# Patient Record
Sex: Female | Born: 1940 | Race: White | Hispanic: No | Marital: Married | Smoking: Never smoker
Health system: Southern US, Community
[De-identification: ages and names within clinical notes are randomized; demographics above are authoritative.]

## PROBLEM LIST (undated history)

## (undated) DIAGNOSIS — E785 Hyperlipidemia, unspecified: Secondary | ICD-10-CM

## (undated) DIAGNOSIS — E079 Disorder of thyroid, unspecified: Secondary | ICD-10-CM

## (undated) DIAGNOSIS — M549 Dorsalgia, unspecified: Secondary | ICD-10-CM

## (undated) DIAGNOSIS — M67911 Unspecified disorder of synovium and tendon, right shoulder: Secondary | ICD-10-CM

## (undated) HISTORY — PX: HERNIA REPAIR: SHX51

---

## 1999-05-20 ENCOUNTER — Other Ambulatory Visit: Admission: RE | Admit: 1999-05-20 | Discharge: 1999-05-20 | Payer: Self-pay | Admitting: Obstetrics & Gynecology

## 2013-03-13 DIAGNOSIS — M67919 Unspecified disorder of synovium and tendon, unspecified shoulder: Secondary | ICD-10-CM | POA: Insufficient documentation

## 2013-03-16 DIAGNOSIS — M7531 Calcific tendinitis of right shoulder: Secondary | ICD-10-CM | POA: Insufficient documentation

## 2014-02-21 DIAGNOSIS — K5792 Diverticulitis of intestine, part unspecified, without perforation or abscess without bleeding: Secondary | ICD-10-CM | POA: Insufficient documentation

## 2014-02-27 DIAGNOSIS — E785 Hyperlipidemia, unspecified: Secondary | ICD-10-CM | POA: Insufficient documentation

## 2014-02-27 DIAGNOSIS — E039 Hypothyroidism, unspecified: Secondary | ICD-10-CM | POA: Insufficient documentation

## 2014-07-18 DIAGNOSIS — L821 Other seborrheic keratosis: Secondary | ICD-10-CM | POA: Insufficient documentation

## 2015-01-22 DIAGNOSIS — R739 Hyperglycemia, unspecified: Secondary | ICD-10-CM | POA: Insufficient documentation

## 2015-07-17 DIAGNOSIS — Z86010 Personal history of colonic polyps: Secondary | ICD-10-CM | POA: Insufficient documentation

## 2016-01-15 DIAGNOSIS — M542 Cervicalgia: Secondary | ICD-10-CM | POA: Insufficient documentation

## 2016-01-15 DIAGNOSIS — M545 Low back pain, unspecified: Secondary | ICD-10-CM | POA: Insufficient documentation

## 2016-01-15 DIAGNOSIS — M546 Pain in thoracic spine: Secondary | ICD-10-CM | POA: Insufficient documentation

## 2016-01-15 DIAGNOSIS — M7702 Medial epicondylitis, left elbow: Secondary | ICD-10-CM | POA: Insufficient documentation

## 2016-06-09 DIAGNOSIS — H903 Sensorineural hearing loss, bilateral: Secondary | ICD-10-CM | POA: Insufficient documentation

## 2016-06-09 DIAGNOSIS — H8111 Benign paroxysmal vertigo, right ear: Secondary | ICD-10-CM | POA: Insufficient documentation

## 2016-08-17 ENCOUNTER — Encounter (HOSPITAL_BASED_OUTPATIENT_CLINIC_OR_DEPARTMENT_OTHER): Payer: Self-pay | Admitting: Emergency Medicine

## 2016-08-17 ENCOUNTER — Emergency Department (HOSPITAL_BASED_OUTPATIENT_CLINIC_OR_DEPARTMENT_OTHER): Payer: Medicare Other

## 2016-08-17 ENCOUNTER — Emergency Department (HOSPITAL_BASED_OUTPATIENT_CLINIC_OR_DEPARTMENT_OTHER)
Admission: EM | Admit: 2016-08-17 | Discharge: 2016-08-18 | Disposition: A | Discharge status: Home / Self Care | Payer: Medicare Other | Attending: Emergency Medicine | Admitting: Emergency Medicine

## 2016-08-17 DIAGNOSIS — E039 Hypothyroidism, unspecified: Secondary | ICD-10-CM | POA: Insufficient documentation

## 2016-08-17 DIAGNOSIS — M7551 Bursitis of right shoulder: Secondary | ICD-10-CM | POA: Insufficient documentation

## 2016-08-17 DIAGNOSIS — Z79899 Other long term (current) drug therapy: Secondary | ICD-10-CM | POA: Insufficient documentation

## 2016-08-17 DIAGNOSIS — R509 Fever, unspecified: Secondary | ICD-10-CM

## 2016-08-17 DIAGNOSIS — M25511 Pain in right shoulder: Secondary | ICD-10-CM | POA: Diagnosis present

## 2016-08-17 HISTORY — DX: Unspecified disorder of synovium and tendon, right shoulder: M67.911

## 2016-08-17 HISTORY — DX: Dorsalgia, unspecified: M54.9

## 2016-08-17 HISTORY — DX: Hyperlipidemia, unspecified: E78.5

## 2016-08-17 HISTORY — DX: Disorder of thyroid, unspecified: E07.9

## 2016-08-17 LAB — URINALYSIS, MICROSCOPIC (REFLEX)

## 2016-08-17 LAB — COMPREHENSIVE METABOLIC PANEL
ALT: 39 U/L (ref 14–54)
AST: 36 U/L (ref 15–41)
Albumin: 4.4 g/dL (ref 3.5–5.0)
Alkaline Phosphatase: 80 U/L (ref 38–126)
Anion gap: 8 (ref 5–15)
BILIRUBIN TOTAL: 0.6 mg/dL (ref 0.3–1.2)
BUN: 15 mg/dL (ref 6–20)
CO2: 26 mmol/L (ref 22–32)
Calcium: 9.8 mg/dL (ref 8.9–10.3)
Chloride: 104 mmol/L (ref 101–111)
Creatinine, Ser: 1.03 mg/dL — ABNORMAL HIGH (ref 0.44–1.00)
GFR, EST NON AFRICAN AMERICAN: 52 mL/min — AB (ref >60)
Glucose, Bld: 128 mg/dL — ABNORMAL HIGH (ref 65–99)
POTASSIUM: 4.1 mmol/L (ref 3.5–5.1)
Sodium: 138 mmol/L (ref 135–145)
TOTAL PROTEIN: 8 g/dL (ref 6.5–8.1)

## 2016-08-17 LAB — SEDIMENTATION RATE: Sed Rate: 43 mm/hr — ABNORMAL HIGH (ref 0–22)

## 2016-08-17 LAB — URINALYSIS, ROUTINE W REFLEX MICROSCOPIC
Bilirubin Urine: NEGATIVE
Glucose, UA: NEGATIVE mg/dL
KETONES UR: 15 mg/dL — AB
Leukocytes, UA: NEGATIVE
NITRITE: NEGATIVE
PH: 6 (ref 5.0–8.0)
Protein, ur: NEGATIVE mg/dL
Specific Gravity, Urine: 1.022 (ref 1.005–1.030)

## 2016-08-17 LAB — CBC WITH DIFFERENTIAL/PLATELET
BASOS ABS: 0 10*3/uL (ref 0.0–0.1)
Basophils Relative: 0 %
EOS PCT: 0 %
Eosinophils Absolute: 0 10*3/uL (ref 0.0–0.7)
HEMATOCRIT: 37.6 % (ref 36.0–46.0)
HEMOGLOBIN: 12.3 g/dL (ref 12.0–15.0)
LYMPHS PCT: 15 %
Lymphs Abs: 1.8 10*3/uL (ref 0.7–4.0)
MCH: 29.8 pg (ref 26.0–34.0)
MCHC: 32.7 g/dL (ref 30.0–36.0)
MCV: 91 fL (ref 78.0–100.0)
Monocytes Absolute: 1.4 10*3/uL — ABNORMAL HIGH (ref 0.1–1.0)
Monocytes Relative: 12 %
NEUTROS ABS: 8.8 10*3/uL — AB (ref 1.7–7.7)
NEUTROS PCT: 73 %
PLATELETS: 220 10*3/uL (ref 150–400)
RBC: 4.13 MIL/uL (ref 3.87–5.11)
RDW: 13.3 % (ref 11.5–15.5)
WBC: 12 10*3/uL — AB (ref 4.0–10.5)

## 2016-08-17 MED ORDER — LIDOCAINE HCL (PF) 1 % IJ SOLN
5.0000 mL | Freq: Once | INTRAMUSCULAR | Status: AC
  Administered 2016-08-17: 5 mL via INTRADERMAL
  Filled 2016-08-17: qty 5

## 2016-08-17 MED ORDER — MORPHINE SULFATE (PF) 4 MG/ML IV SOLN
4.0000 mg | Freq: Once | INTRAVENOUS | Status: AC
  Administered 2016-08-17: 4 mg via INTRAVENOUS
  Filled 2016-08-17: qty 1

## 2016-08-17 MED ORDER — OXYCODONE HCL 5 MG PO TABS: 2.5000 mg | ORAL_TABLET | ORAL | 0 refills | Status: DC | PRN

## 2016-08-17 MED ORDER — SODIUM CHLORIDE 0.9 % IV BOLUS (SEPSIS)
1000.0000 mL | Freq: Once | INTRAVENOUS | Status: AC
  Administered 2016-08-17: 1000 mL via INTRAVENOUS

## 2016-08-17 MED ORDER — ACETAMINOPHEN 325 MG PO TABS
650.0000 mg | ORAL_TABLET | Freq: Once | ORAL | Status: AC
Start: 2016-08-17 — End: 2016-08-17
  Administered 2016-08-17: 650 mg via ORAL
  Filled 2016-08-17: qty 2

## 2016-08-17 MED ORDER — DOCUSATE SODIUM 100 MG PO CAPS: 100.0000 mg | ORAL_CAPSULE | Freq: Two times a day (BID) | ORAL | 0 refills | Status: DC

## 2016-08-17 MED ORDER — ONDANSETRON HCL 4 MG/2ML IJ SOLN
4.0000 mg | Freq: Once | INTRAMUSCULAR | Status: AC
  Administered 2016-08-17: 4 mg via INTRAVENOUS
  Filled 2016-08-17: qty 2

## 2016-08-17 MED ORDER — DOXYCYCLINE HYCLATE 100 MG PO CAPS: 100.0000 mg | ORAL_CAPSULE | Freq: Two times a day (BID) | ORAL | 0 refills | Status: DC

## 2016-08-17 NOTE — ED Triage Notes (Addendum)
Patient c/o right shoulder pain. Patient reports she had a rotator cuff tear in 2014, seen at [facility]. Patient reports the pain return 3 days ago. Patient c/o feeling hot, 101.3 oral on arrival. Patient being treated for chronic back pain. Patient states this is the same pain she had with her prior injury. Denies new injuries.

## 2016-08-17 NOTE — Discharge Instructions (Signed)
Return to the emergency department for new or worsening symptoms including fever,shaking chills.
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
## 2016-08-17 NOTE — ED Notes (Signed)
ED Provider at bedside. Doing procedure

## 2016-08-18 LAB — C-REACTIVE PROTEIN: CRP: 14.7 mg/dL — ABNORMAL HIGH (ref <1.0)

## 2016-08-19 ENCOUNTER — Encounter: Payer: Self-pay | Admitting: Family Medicine

## 2016-08-19 ENCOUNTER — Telehealth: Payer: Self-pay | Admitting: Family Medicine

## 2016-08-19 ENCOUNTER — Ambulatory Visit (INDEPENDENT_AMBULATORY_CARE_PROVIDER_SITE_OTHER): Payer: Medicare Other | Admitting: Family Medicine

## 2016-08-19 DIAGNOSIS — M25511 Pain in right shoulder: Secondary | ICD-10-CM

## 2016-08-19 NOTE — Patient Instructions (Addendum)
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 Finish the antibiotic though - I cannot explain why you had a fever and it would be unusual for you to have a septic joint without any other risk factors for this. Focus on the motion exercises at least twice a day - arm circles, swings, table slides 3 sets of 10 each time. Take tylenol 500mg  1-2 tabs three times a day. Topical capsaicin or biofreeze up to 4 times a day. Icing 15 minutes at a time 3-4 times a day. Follow up with me in 2 weeks at the latest but call me sooner if you're not improving. Physical therapy, MRI are considerations in the future.

## 2016-08-20 DIAGNOSIS — M25511 Pain in right shoulder: Secondary | ICD-10-CM | POA: Insufficient documentation

## 2016-08-20 NOTE — Progress Notes (Signed)
PCP: No primary care provider on file.  Subjective:   HPI: Patient is a 76 y.o. female here for right shoulder pain.  Patient reports about 4-5 days ago she started to get achy pain in lateral right shoulder. Associated with decrease in motion that has worsened since then. She is right handed. Pain level is now 7-8/10 and sharp. Has improved slightly in her motion. In ED was noted to have a fever of 101.3 and reports she hasn't been ill. Shoulder felt warm - was given doxycycline which she's been taking. Not noticed any fevers or sweats since then either. Given hydrocodone too but does not like to take this. Some swelling of shoulder but no skin changes, numbness. 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 She had cortisone shot in the past and had a bad reaction to this, was advised not to get again though she recalls having steroid eyedrops without reaction.  Past Medical History:  Diagnosis Date  . Back pain   . Hyperlipemia   . Rotator cuff disorder, right   . Thyroid disease    hypo    Current Outpatient Prescriptions on File Prior to Visit  Medication Sig Dispense Refill  . acetaminophen (TYLENOL) 500 MG tablet Take 500 mg by mouth every 6 (six) hours as needed.    . docusate sodium (COLACE) 100 MG capsule Take 1 capsule (100 mg total) by mouth every 12 (twelve) hours. 30 capsule 0  . doxycycline (VIBRAMYCIN) 100 MG capsule Take 1 capsule (100 mg total) by mouth 2 (two) times daily. 20 capsule 0  . gabapentin (NEURONTIN) 300 MG capsule Take 300 mg by mouth 3 (three) times daily.    Marland Kitchen levothyroxine (SYNTHROID, LEVOTHROID) 112 MCG tablet Take 112 mcg by mouth daily before breakfast.    . metaxalone (SKELAXIN) 800 MG tablet Take 800 mg by mouth 3 (three) times daily.    Marland Kitchen oxyCODONE (ROXICODONE) 5 MG immediate release tablet Take 0.5-1 tablets (2.5-5 mg total) by mouth every 4 (four) hours as needed for severe pain. 12 tablet 0  . simvastatin  (ZOCOR) 10 MG tablet Take 10 mg by mouth daily.     No current facility-administered medications on file prior to visit.     Past Surgical History:  Procedure Laterality Date  . CESAREAN SECTION    . HERNIA REPAIR      Allergies  Allergen Reactions  . Aspirin Other (See Comments)    Hx of stomach ulcers  . Nitrofurantoin Macrocrystal Other (See Comments)    hallucination  . Nsaids Nausea Only  . Celecoxib Other (See Comments)    Extreme stomach pain  . Cyclobenzaprine Other (See Comments)    drowsy    Social History   Social History  . Marital status: Married    Spouse name: N/A  . Number of children: N/A  . Years of education: N/A   Occupational History  . Not on file.   Social History Main Topics  . Smoking status: Never Smoker  . Smokeless tobacco: Never Used  . Alcohol use No  . Drug use: No  . Sexual activity: Not on file   Other Topics Concern  . Not on file   Social History Narrative  . No narrative on file    No family history on file.  BP 124/81   Pulse 73   Temp 98 F (36.7 C)   Ht 5\' 4"  (1.626 m)   Wt 170 lb (77.1 kg)  BMI 29.18 kg/m   Review of Systems: See HPI above.     Objective:  Physical Exam:  Gen: NAD, comfortable in exam room  Right shoulder: Mild swelling.  Equal warmth both shoulders.  No erythema.  No ecchymoses.  No other gross deformity. TTP over anterior shoulder joint.  No other tenderness. ROM limited to 30 degrees ER, full IR, abduction and flexion 50 degrees. Positive Hawkins, Neers. Negative Yergasons. Strength 5/5 with resisted internal/external rotation. NV intact distally.  MSK u/s right shoulder:  Biceps tendon intact on trans and long views.  AC joint with mild arthropathy.  Subscapularis and supraspinatus both have intratendinous calcifications with shadowing but no evidence tears.  Infraspinatus appears normal.  Bursitis evident as well with mild swelling and calcifications.  Unable to appreciate an  effusion.   Assessment & Plan:  1. Right shoulder pain - consistent with calcific tendinitis, bursitis, and frozen shoulder.  She did have documented oral fever in ED but no risk factors for a septic joint, afebrile today.  Advised her to continue with doxycycline regardless.  She had poor reaction to cortisone injection in past so will not repeat this.  Shown codman exercises to do twice a day at least.  Tylenol, icing/heat, topical medications.  F/u in 2 weeks.  Consider MRI if not improving as expected.

## 2016-08-20 NOTE — Assessment & Plan Note (Signed)
consistent with calcific tendinitis, bursitis, and frozen shoulder.  She did have documented oral fever in ED but no risk factors for a septic joint, afebrile today.  Advised her to continue with doxycycline regardless.  She had poor reaction to cortisone injection in past so will not repeat this.  Shown codman exercises to do twice a day at least.  Tylenol, icing/heat, topical medications.  F/u in 2 weeks.  Consider MRI if not improving as expected.

## 2016-08-20 NOTE — Telephone Encounter (Signed)
That's to be expected in her case and her husband mentioned it during the visit yesterday.  I wouldn't worry about that and swelling is often the last thing to go away in cases like this.  I'd encourage her to do her exercises as we discussed.  Be on the lookout for return of a fever (above 100.4) or a rash.  If she gets either of these she should call us.

## 2016-08-20 NOTE — Telephone Encounter (Signed)
Spoke to patient and gave her information provided by the physician. 

## 2016-09-02 ENCOUNTER — Ambulatory Visit (INDEPENDENT_AMBULATORY_CARE_PROVIDER_SITE_OTHER): Payer: Medicare Other | Admitting: Family Medicine

## 2016-09-02 DIAGNOSIS — M7531 Calcific tendinitis of right shoulder: Secondary | ICD-10-CM

## 2016-09-02 NOTE — Assessment & Plan Note (Signed)
2/2 calcific tendinitis, bursitis, and frozen shoulder - significantly improved.  Could not rule out early septic joint given she had a fever in ED and improved significantly after 2 weeks and treatment that included antibiotics.  She reports having a blood stick in this arm prior to this.  Tylenol, icing/heat, topical medications only if needed.  Given home exercise program to do daily for next month.  Follow up with us as needed otherwise.

## 2016-09-02 NOTE — Progress Notes (Signed)
PCP: Angelica Chessman., MD  Subjective:   HPI: Patient is a 76 y.o. female here for right shoulder pain.  2/14: Patient reports about 4-5 days ago she started to get achy pain in lateral right shoulder. Associated with decrease in motion that has worsened since then. She is right handed. Pain level is now 7-8/10 and sharp. Has improved slightly in her motion. In ED was noted to have a fever of 101.3 and reports she hasn't been ill. Shoulder felt warm - was given doxycycline which she's been taking. Not noticed any fevers or sweats since then either. Given hydrocodone too but does not like to take this. Some swelling of shoulder but no skin changes, numbness. 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 She had cortisone shot in the past and had a bad reaction to this, was advised not to get again though she recalls having steroid eyedrops without reaction.  2/28: Patient reports she's doing much better. Pain level is 0/10. Can feel some soreness only if reaching way out to the side and behind her. No skin changes, numbness. Not taking any medicines for the shoulder now. No skin changes, numbness.  Past Medical History:  Diagnosis Date  . Back pain   . Hyperlipemia   . Rotator cuff disorder, right   . Thyroid disease    hypo    Current Outpatient Prescriptions on File Prior to Visit  Medication Sig Dispense Refill  . acetaminophen (TYLENOL) 500 MG tablet Take 500 mg by mouth every 6 (six) hours as needed.    . docusate sodium (COLACE) 100 MG capsule Take 1 capsule (100 mg total) by mouth every 12 (twelve) hours. 30 capsule 0  . doxycycline (VIBRAMYCIN) 100 MG capsule Take 1 capsule (100 mg total) by mouth 2 (two) times daily. 20 capsule 0  . gabapentin (NEURONTIN) 300 MG capsule Take 300 mg by mouth 3 (three) times daily.    Marland Kitchen levothyroxine (SYNTHROID, LEVOTHROID) 112 MCG tablet Take 112 mcg by mouth daily before breakfast.    . metaxalone  (SKELAXIN) 800 MG tablet Take 800 mg by mouth 3 (three) times daily.    Marland Kitchen oxyCODONE (ROXICODONE) 5 MG immediate release tablet Take 0.5-1 tablets (2.5-5 mg total) by mouth every 4 (four) hours as needed for severe pain. 12 tablet 0  . simvastatin (ZOCOR) 10 MG tablet Take 10 mg by mouth daily.     No current facility-administered medications on file prior to visit.     Past Surgical History:  Procedure Laterality Date  . CESAREAN SECTION    . HERNIA REPAIR      Allergies  Allergen Reactions  . Aspirin Other (See Comments)    Hx of stomach ulcers  . Nitrofurantoin Macrocrystal Other (See Comments)    hallucination  . Nsaids Nausea Only  . Celecoxib Other (See Comments)    Extreme stomach pain  . Cyclobenzaprine Other (See Comments)    drowsy    Social History   Social History  . Marital status: Married    Spouse name: N/A  . Number of children: N/A  . Years of education: N/A   Occupational History  . Not on file.   Social History Main Topics  . Smoking status: Never Smoker  . Smokeless tobacco: Never Used  . Alcohol use No  . Drug use: No  . Sexual activity: Not on file   Other Topics Concern  . Not on file   Social History Narrative  .  No narrative on file    No family history on file.  There were no vitals taken for this visit.  Review of Systems: See HPI above.     Objective:  Physical Exam:  Gen: NAD, comfortable in exam room  Right shoulder: No swelling, warmth, erythema.  No ecchymoses.  No other gross deformity. No TTP FROM Negative Hawkins, Neers. Negative Yergasons. Strength 5/5 with resisted internal/external rotation. NV intact distally.  Assessment & Plan:  1. Right shoulder pain - 2/2 calcific tendinitis, bursitis, and frozen shoulder - significantly improved.  Could not rule out early septic joint given she had a fever in ED and improved significantly after 2 weeks and treatment that included antibiotics.  She reports having a  blood stick in this arm prior to this.  Tylenol, icing/heat, topical medications only if needed.  Given home exercise program to do daily for next month.  Follow up with us as needed otherwise.

## 2016-09-02 NOTE — Patient Instructions (Signed)
Do the home exercises with theraband most days of the week for the next month - up to 3 sets of 10 once a day only. Call me if you have any problems otherwise follow up as needed.

## 2018-11-07 IMAGING — CR DG SHOULDER 2+V*R*
3 series · 3 of 3 positions shown · non-contrast
Comparison: None.

CLINICAL DATA: Severe pain for 3 days. Right shoulder pain feels
like a rotator cuff injury from the past. Symptoms off and on for
few months. Limited range of motion.

EXAM:
RIGHT SHOULDER - 2+ VIEW

[w shoulder grashey right]
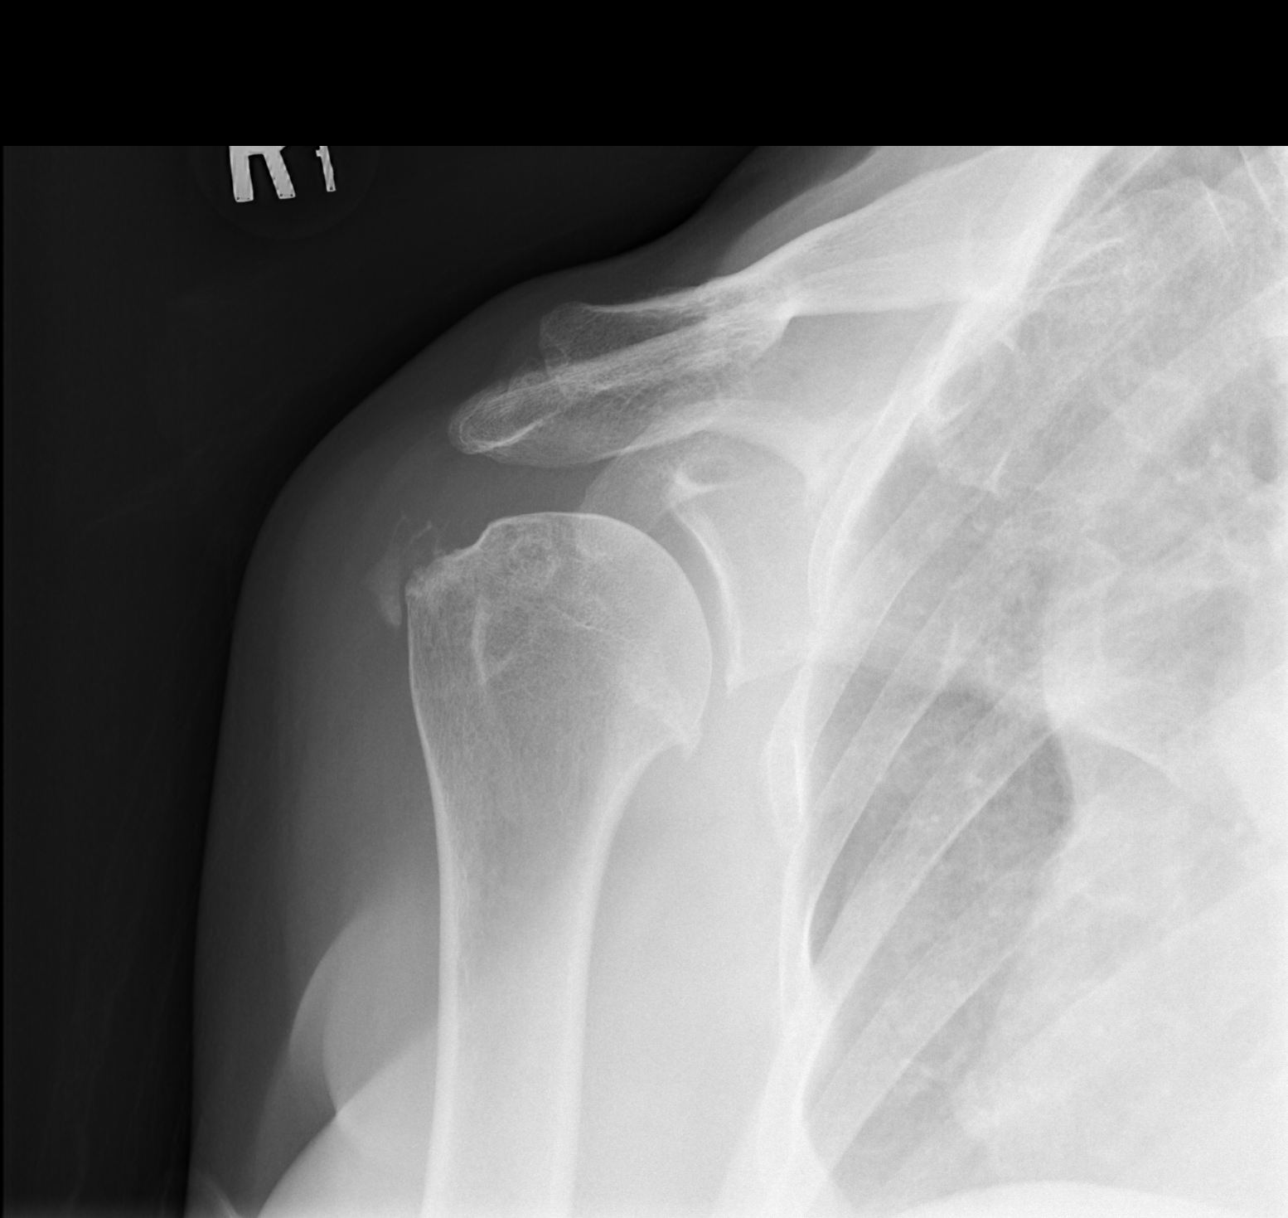

[w shoulder y view right (1 of 2)]
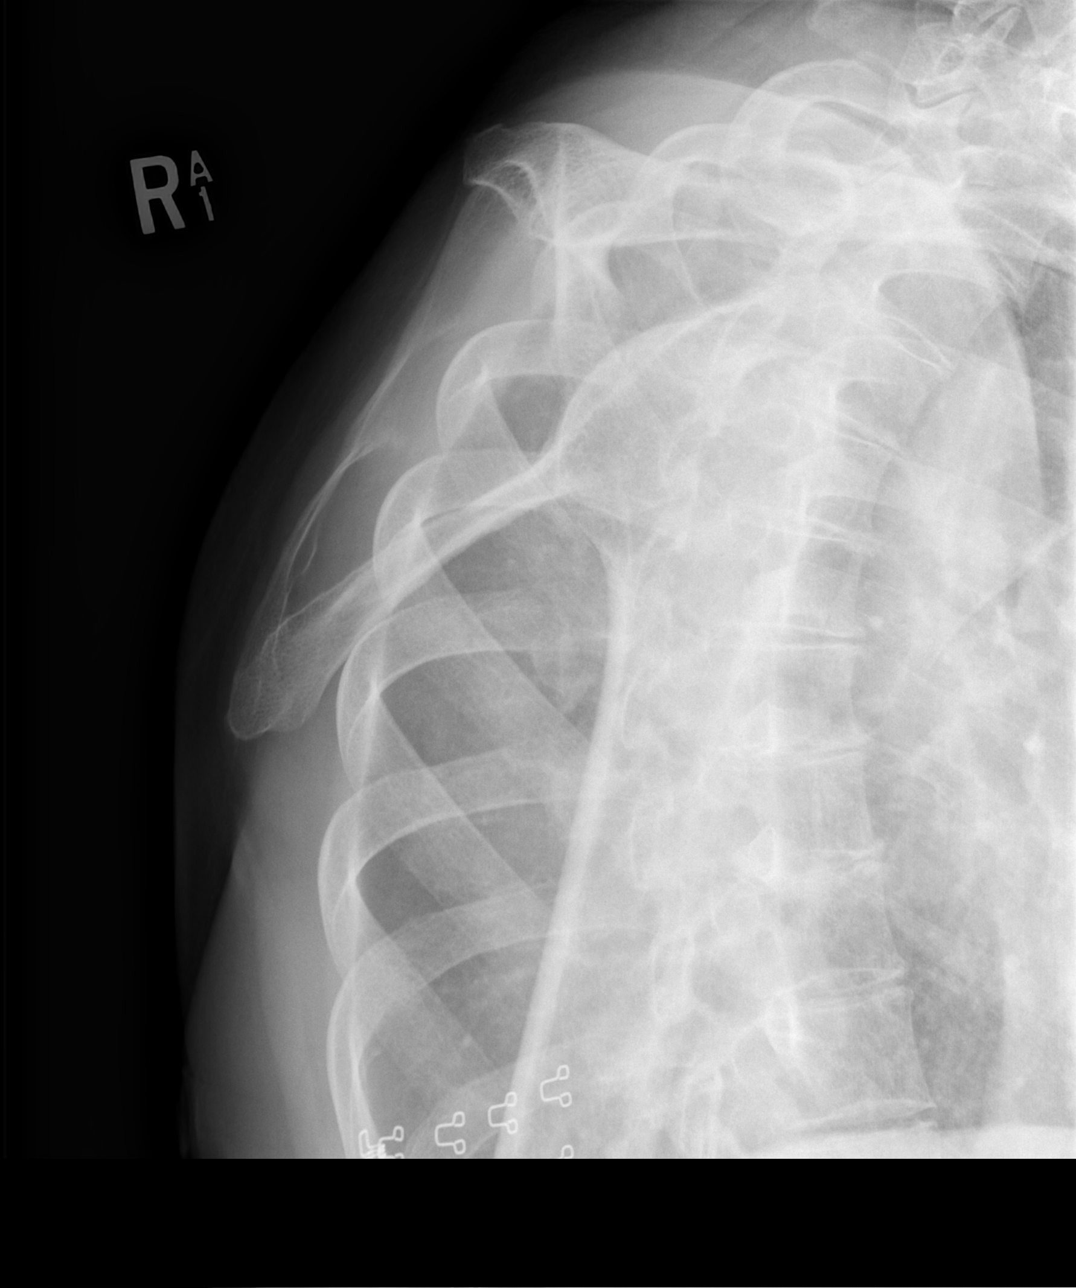

[w shoulder y view right (2 of 2)]
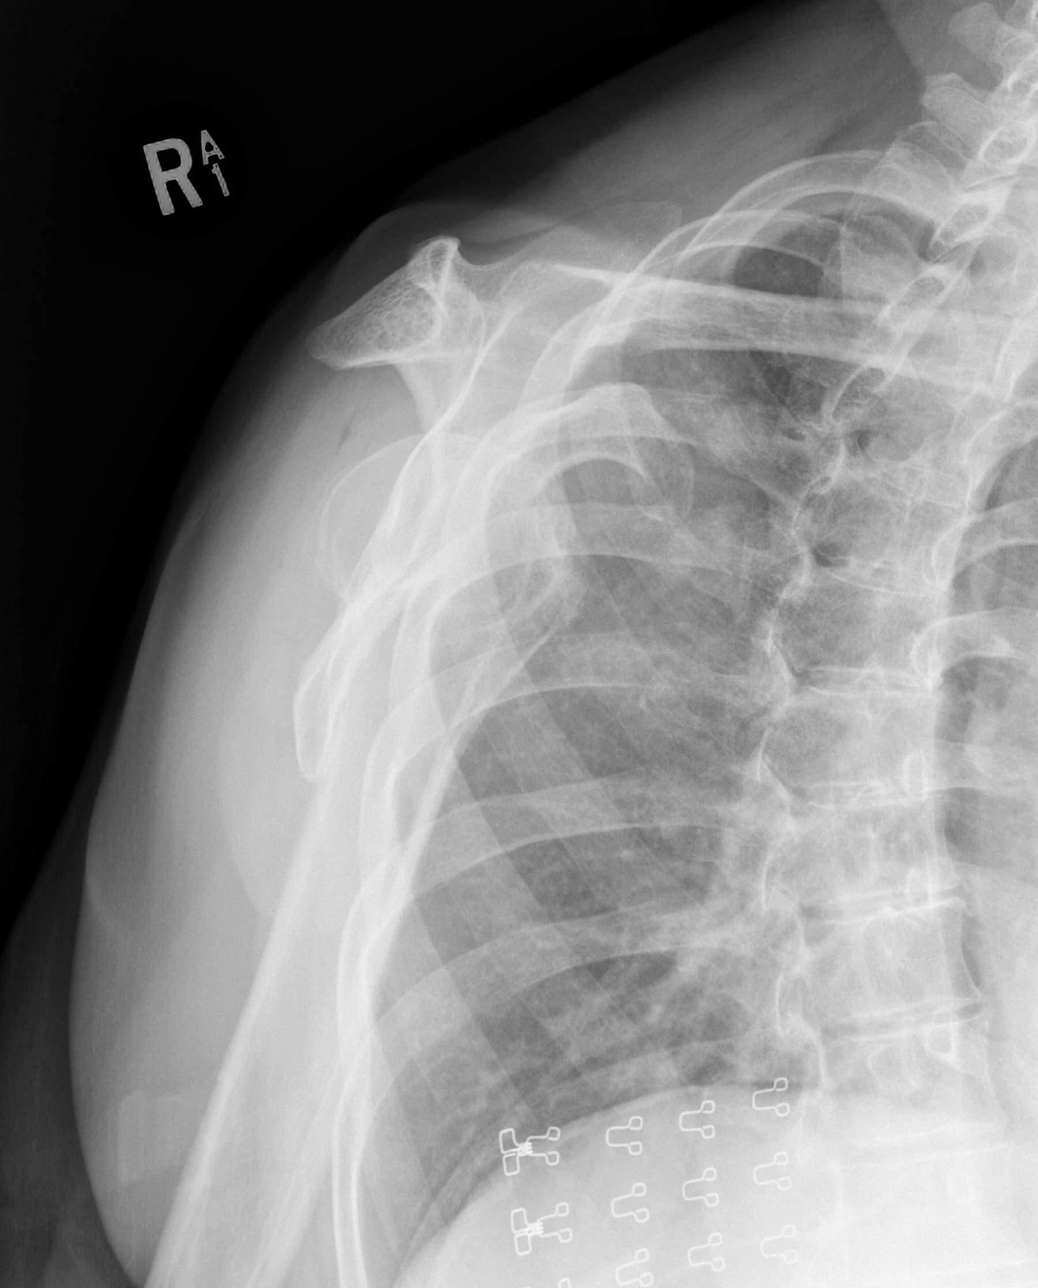

[3 of 3 positions shown; findings below may reference images not displayed]

FINDINGS: Study quality is degraded by nonstandard views, limited by patient's
pain.There is subacromial narrowing. Small osteophyte identified at
the inferior aspect of the glenoid and the humeral head, consistent
chronic rotator cuff injury. There is hydroxyapatite deposition
disease in the rotator cuff. No acute fracture or dislocation.
IMPRESSION: Chronic changes indicative of chronic rotator cuff injury. No acute
fracture.

## 2022-09-26 ENCOUNTER — Emergency Department (HOSPITAL_BASED_OUTPATIENT_CLINIC_OR_DEPARTMENT_OTHER): Payer: Medicare Other

## 2022-09-26 ENCOUNTER — Other Ambulatory Visit: Payer: Self-pay

## 2022-09-26 ENCOUNTER — Emergency Department (HOSPITAL_BASED_OUTPATIENT_CLINIC_OR_DEPARTMENT_OTHER)
Admission: EM | Admit: 2022-09-26 | Discharge: 2022-09-26 | Disposition: A | Discharge status: Home / Self Care | Payer: Medicare Other | Attending: Emergency Medicine | Admitting: Emergency Medicine

## 2022-09-26 ENCOUNTER — Encounter (HOSPITAL_BASED_OUTPATIENT_CLINIC_OR_DEPARTMENT_OTHER): Payer: Self-pay | Admitting: Emergency Medicine

## 2022-09-26 DIAGNOSIS — J45909 Unspecified asthma, uncomplicated: Secondary | ICD-10-CM | POA: Insufficient documentation

## 2022-09-26 DIAGNOSIS — R051 Acute cough: Secondary | ICD-10-CM

## 2022-09-26 DIAGNOSIS — J181 Lobar pneumonia, unspecified organism: Secondary | ICD-10-CM | POA: Insufficient documentation

## 2022-09-26 DIAGNOSIS — R079 Chest pain, unspecified: Secondary | ICD-10-CM | POA: Diagnosis present

## 2022-09-26 DIAGNOSIS — Z79899 Other long term (current) drug therapy: Secondary | ICD-10-CM | POA: Diagnosis not present

## 2022-09-26 DIAGNOSIS — R0602 Shortness of breath: Secondary | ICD-10-CM | POA: Diagnosis not present

## 2022-09-26 DIAGNOSIS — R0600 Dyspnea, unspecified: Secondary | ICD-10-CM

## 2022-09-26 DIAGNOSIS — E039 Hypothyroidism, unspecified: Secondary | ICD-10-CM | POA: Insufficient documentation

## 2022-09-26 DIAGNOSIS — J189 Pneumonia, unspecified organism: Secondary | ICD-10-CM

## 2022-09-26 LAB — CBC WITH DIFFERENTIAL/PLATELET
Abs Immature Granulocytes: 0.03 10*3/uL (ref 0.00–0.07)
Basophils Absolute: 0 10*3/uL (ref 0.0–0.1)
Basophils Relative: 1 %
Eosinophils Absolute: 0.2 10*3/uL (ref 0.0–0.5)
Eosinophils Relative: 2 %
HCT: 34.6 % — ABNORMAL LOW (ref 36.0–46.0)
Hemoglobin: 11.3 g/dL — ABNORMAL LOW (ref 12.0–15.0)
Immature Granulocytes: 0 %
Lymphocytes Relative: 18 %
Lymphs Abs: 1.5 10*3/uL (ref 0.7–4.0)
MCH: 29.7 pg (ref 26.0–34.0)
MCHC: 32.7 g/dL (ref 30.0–36.0)
MCV: 90.8 fL (ref 80.0–100.0)
Monocytes Absolute: 0.8 10*3/uL (ref 0.1–1.0)
Monocytes Relative: 9 %
Neutro Abs: 5.8 10*3/uL (ref 1.7–7.7)
Neutrophils Relative %: 70 %
Platelets: 219 10*3/uL (ref 150–400)
RBC: 3.81 MIL/uL — ABNORMAL LOW (ref 3.87–5.11)
RDW: 13.5 % (ref 11.5–15.5)
WBC: 8.4 10*3/uL (ref 4.0–10.5)
nRBC: 0 % (ref 0.0–0.2)

## 2022-09-26 LAB — BRAIN NATRIURETIC PEPTIDE: B Natriuretic Peptide: 26.1 pg/mL (ref 0.0–100.0)

## 2022-09-26 LAB — COMPREHENSIVE METABOLIC PANEL
ALT: 13 U/L (ref 0–44)
AST: 17 U/L (ref 15–41)
Albumin: 3.9 g/dL (ref 3.5–5.0)
Alkaline Phosphatase: 63 U/L (ref 38–126)
Anion gap: 10 (ref 5–15)
BUN: 15 mg/dL (ref 8–23)
CO2: 25 mmol/L (ref 22–32)
Calcium: 9.4 mg/dL (ref 8.9–10.3)
Chloride: 102 mmol/L (ref 98–111)
Creatinine, Ser: 1.07 mg/dL — ABNORMAL HIGH (ref 0.44–1.00)
GFR, Estimated: 53 mL/min — ABNORMAL LOW (ref >60)
Glucose, Bld: 128 mg/dL — ABNORMAL HIGH (ref 70–99)
Potassium: 3.8 mmol/L (ref 3.5–5.1)
Sodium: 137 mmol/L (ref 135–145)
Total Bilirubin: 0.5 mg/dL (ref 0.3–1.2)
Total Protein: 7.3 g/dL (ref 6.5–8.1)

## 2022-09-26 LAB — TROPONIN I (HIGH SENSITIVITY): Troponin I (High Sensitivity): 3 ng/L (ref <18)

## 2022-09-26 MED ORDER — AEROCHAMBER PLUS FLO-VU MEDIUM MISC
1.0000 | Freq: Once | Status: AC
  Administered 2022-09-26: 1
  Filled 2022-09-26: qty 10

## 2022-09-26 MED ORDER — PREDNISONE 10 MG PO TABS: 40.0000 mg | ORAL_TABLET | Freq: Every day | ORAL | 0 refills | Status: AC

## 2022-09-26 MED ORDER — ALBUTEROL SULFATE HFA 108 (90 BASE) MCG/ACT IN AERS
2.0000 | INHALATION_SPRAY | Freq: Once | RESPIRATORY_TRACT | Status: AC
  Administered 2022-09-26: 2 via RESPIRATORY_TRACT
  Filled 2022-09-26: qty 6.7

## 2022-09-26 MED ORDER — CEFDINIR 300 MG PO CAPS: 300.0000 mg | ORAL_CAPSULE | Freq: Two times a day (BID) | ORAL | 0 refills | Status: AC

## 2022-09-26 MED ORDER — IOHEXOL 350 MG/ML SOLN
75.0000 mL | Freq: Once | INTRAVENOUS | Status: AC | PRN
  Administered 2022-09-26: 75 mL via INTRAVENOUS

## 2022-09-26 MED ORDER — AZITHROMYCIN 250 MG PO TABS: 250.0000 mg | ORAL_TABLET | Freq: Every day | ORAL | 0 refills | Status: DC

## 2022-09-26 NOTE — ED Triage Notes (Signed)
Patient c/o chest pain with shortness of breath x 5 months. Symptoms have increased last night.

## 2022-09-26 NOTE — ED Notes (Signed)
Patient educated on the use of MDI and spacer. Performed good technique.

## 2022-09-26 NOTE — ED Provider Notes (Signed)
[place] [facility] Provider Note   CSN: [id number] Arrival date & time: 09/26/22  1448     History {Add pertinent medical, surgical, social history, OB history to HPI:1} Chief Complaint  Patient presents with   Chest Pain    Debra Baird is a 82 y.o. female.  HPI       Chest pains and coughing for 5 months Went to urgent care, to PCP, did CTs, CXRs, ECHO, everything seemed ok, had 75mm nodule right lower side. Made an appointment with lung doctor but not until 4/12 Progressive Waxing and waning, will seem better and then come back worse Will sometimes go 1-1.5 weeks with improvement and then it comes back  Has had wheezing, feels like it is top part of chest, pain and soreness upper substernal, voice raspy for probably one month  When lay down the chest pain is worse, when sleep on stomach it helps the cough and the wheezing, sometimes something catches and it starts.  If bend down to pick something up it starts No swelling of legs No fever  Has had congestion, in December had sinus infection, congestion has now cleared up. Received cefdinir from pcp.  Did see a tinge of blood when she blew her nose yesterday but not like symptoms had prior in December.   Some lightheadedness, has been taking protonix because intermittent abdominal pain  Trying albuterol but doesn't feel that helps Had some asthma diagnosis with pregnancy 52 years ago August had boosters  Years ago did smoke 50 years ago. No etoh or other drugs. Past Medical History:  Diagnosis Date   Back pain    Hyperlipemia    Rotator cuff disorder, right    Thyroid disease    hypo     Home Medications Prior to Admission medications   Medication Sig Start Date End Date Taking? Authorizing Provider  acetaminophen (TYLENOL) 500 MG tablet Take 500 mg by mouth every 6 (six) hours as needed.    [provider]  docusate sodium (COLACE) 100 MG capsule Take 1 capsule  (100 mg total) by mouth every 12 (twelve) hours. 08/17/16   Margarita Mail, PA-C  doxycycline (VIBRAMYCIN) 100 MG capsule Take 1 capsule (100 mg total) by mouth 2 (two) times daily. 08/17/16   Margarita Mail, PA-C  gabapentin (NEURONTIN) 300 MG capsule Take 300 mg by mouth 3 (three) times daily.    [provider]  levothyroxine (SYNTHROID, LEVOTHROID) 112 MCG tablet Take 112 mcg by mouth daily before breakfast.    [provider]  metaxalone (SKELAXIN) 800 MG tablet Take 800 mg by mouth 3 (three) times daily.    [provider]  oxyCODONE (ROXICODONE) 5 MG immediate release tablet Take 0.5-1 tablets (2.5-5 mg total) by mouth every 4 (four) hours as needed for severe pain. 08/17/16   Margarita Mail, PA-C  simvastatin (ZOCOR) 10 MG tablet Take 10 mg by mouth daily.    [provider]  topiramate (TOPAMAX) 25 MG tablet  08/01/16   [provider]      Allergies    Aspirin, Nitrofurantoin macrocrystal, Nsaids, Celecoxib, and Cyclobenzaprine    Review of Systems   Review of Systems  Physical Exam Updated Vital Signs BP 127/74 (BP Location: Left Arm)   Pulse 98   Temp 98 F (36.7 C) (Oral)   Resp 18   Ht 5' 3.5" (1.613 m)   Wt 72.6 kg   SpO2 97%   BMI 27.90 kg/m  Physical Exam  ED Results / Procedures / Treatments   Labs (all labs ordered are listed, but only abnormal results are displayed) Labs Reviewed - No data to display  EKG None  Radiology No results found.  Procedures Procedures  {Document cardiac monitor, telemetry assessment procedure when appropriate:1}  Medications Ordered in ED Medications - No data to display  ED Course/ Medical Decision Making/ A&P   {   Click here for ABCD2, HEART and other calculatorsREFRESH Note before signing :1}                            82 year old female with a history of hyperlipidemia, hypothyroidism, who has been undergoing a workup for cough as an outpatient presents with concern  for progressive shortness of breath, chest pain and cough.  Differential diagnosis for dyspnea includes ACS, PE, COPD exacerbation, CHF exacerbation, anemia, pneumonia, viral etiology such as COVID 19 infection, metabolic abnormality, asthma, GERD, laryngeal etiology.  Labs completed and personally evaluated and interpreted by me show no clinically significant anemia, electrolyte abnormality***.  Troponin within normal limits and have low suspicion for ACS***.  Clinically do not see signs of CHF***.  She has had prior CT chest without contrast, however has not had a contrasted or PE study, and given progressive dyspnea with chest pain, mild tachycardia on arrival, will obtain CT PE study for further evaluation of her dyspnea.  CT personally evaluated by me shows ***.         Final Clinical Impression(s) / ED Diagnoses Final diagnoses:  None    Rx / DC Orders ED Discharge Orders     None

## 2022-11-03 ENCOUNTER — Ambulatory Visit (INDEPENDENT_AMBULATORY_CARE_PROVIDER_SITE_OTHER): Payer: Medicare Other | Admitting: Pulmonary Disease

## 2022-11-03 ENCOUNTER — Encounter: Payer: Self-pay | Admitting: Pulmonary Disease

## 2022-11-03 VITALS — BP 128/80 | HR 70 | Temp 97.8°F | Ht 63.5 in | Wt 163.0 lb

## 2022-11-03 DIAGNOSIS — R0602 Shortness of breath: Secondary | ICD-10-CM

## 2022-11-03 LAB — CBC WITH DIFFERENTIAL/PLATELET
Basophils Absolute: 0.1 10*3/uL (ref 0.0–0.1)
Basophils Relative: 0.8 % (ref 0.0–3.0)
Eosinophils Absolute: 0.1 10*3/uL (ref 0.0–0.7)
Eosinophils Relative: 2.1 % (ref 0.0–5.0)
HCT: 37.4 % (ref 36.0–46.0)
Hemoglobin: 12.6 g/dL (ref 12.0–15.0)
Lymphocytes Relative: 30.1 % (ref 12.0–46.0)
Lymphs Abs: 2 10*3/uL (ref 0.7–4.0)
MCHC: 33.7 g/dL (ref 30.0–36.0)
MCV: 89.5 fl (ref 78.0–100.0)
Monocytes Absolute: 0.6 10*3/uL (ref 0.1–1.0)
Monocytes Relative: 9.2 % (ref 3.0–12.0)
Neutro Abs: 3.9 10*3/uL (ref 1.4–7.7)
Neutrophils Relative %: 57.8 % (ref 43.0–77.0)
Platelets: 237 10*3/uL (ref 150.0–400.0)
RBC: 4.18 Mil/uL (ref 3.87–5.11)
RDW: 13.8 % (ref 11.5–15.5)
WBC: 6.8 10*3/uL (ref 4.0–10.5)

## 2022-11-03 LAB — NITRIC OXIDE: Nitric Oxide: 17

## 2022-11-03 MED ORDER — BREZTRI AEROSPHERE 160-9-4.8 MCG/ACT IN AERO: 2.0000 | INHALATION_SPRAY | Freq: Two times a day (BID) | RESPIRATORY_TRACT | 0 refills | Status: DC

## 2022-11-03 NOTE — Patient Instructions (Addendum)
Will get some labs today including CBC with differential, IgE Check FENO Will give you a sample of Breztri.  Please let us know if this improves any of his symptoms and we can send in a prescription Will order a follow-up high-res CT in 3 months Schedule PFTs in 3 months Return to clinic in 3 months after these tests

## 2022-11-03 NOTE — Progress Notes (Addendum)
Debra Baird    [id number]    Oct 20, 1940  Primary Care Physician:Aguiar, Genia Del, MD  Referring Physician: Angelica Chessman, MD [address] [place],  [place] [address]  Chief complaint: Referral for abnormal CT  HPI: 82 y.o. who  has a past medical history of Back pain, Hyperlipemia, Rotator cuff disorder, right, and Thyroid disease.   Complains of chronic cough, recurrent attacks of bronchitis which started in in August 2023.  She has been treated with multiple rounds of antibiotics including cefdinir, azithromycin and prednisone courses.  Pets: 2 dogs and cat Occupation: Works as a IT consultant Exposures: Recently found that she had significant mold issues in her bathroom and behind shower wall which was cleared out in early 2024.  No hot tubs, Jacuzzi, feather pillows or comforters. Smoking history: Quit smoking in 1972 Travel history: Previously lived in [place] and [place].  No significant recent travel Relevant family history: Mother and brother had COPD.  Dad had asthma.  Outpatient Encounter Medications as of 11/03/2022  Medication Sig   acetaminophen (TYLENOL) 500 MG tablet Take 500 mg by mouth every 6 (six) hours as needed.   Calcium Carb-Cholecalciferol (CALCIUM 600/VITAMIN D PO) Take by mouth. OTC calcium   Calcium Carb-Cholecalciferol (CALCIUM HIGH POTENCY/VITAMIN D) 600-5 MG-MCG TABS Take by mouth.   levothyroxine (SYNTHROID, LEVOTHROID) 112 MCG tablet Take 112 mcg by mouth daily before breakfast.   simvastatin (ZOCOR) 10 MG tablet Take 10 mg by mouth daily.   [DISCONTINUED] azithromycin (ZITHROMAX) 250 MG tablet Take 1 tablet (250 mg total) by mouth daily. Take first 2 tablets together, then 1 every day until finished.   [DISCONTINUED] docusate sodium (COLACE) 100 MG capsule Take 1 capsule (100 mg total) by mouth every 12 (twelve) hours.   [DISCONTINUED] gabapentin (NEURONTIN) 300 MG capsule Take 300 mg by mouth 3 (three) times daily.    [DISCONTINUED] metaxalone (SKELAXIN) 800 MG tablet Take 800 mg by mouth 3 (three) times daily.   [DISCONTINUED] oxyCODONE (ROXICODONE) 5 MG immediate release tablet Take 0.5-1 tablets (2.5-5 mg total) by mouth every 4 (four) hours as needed for severe pain.   [DISCONTINUED] topiramate (TOPAMAX) 25 MG tablet    No facility-administered encounter medications on file as of 11/03/2022.    Allergies as of 11/03/2022 - Review Complete 11/03/2022  Allergen Reaction Noted   Aspirin Other (See Comments) 03/11/2013   Nitrofurantoin macrocrystal Other (See Comments) 07/15/2016   Nsaids Nausea Only 08/17/2016   Celecoxib Other (See Comments) 07/12/2014   Cyclobenzaprine Other (See Comments) 05/05/2016    Past Medical History:  Diagnosis Date   Back pain    Hyperlipemia    Rotator cuff disorder, right    Thyroid disease    hypo    Past Surgical History:  Procedure Laterality Date   CESAREAN SECTION     HERNIA REPAIR      No family history on file.  Social History   Socioeconomic History   Marital status: Married    Spouse name: Not on file   Number of children: Not on file   Years of education: Not on file   Highest education level: Not on file  Occupational History   Not on file  Tobacco Use   Smoking status: Never   Smokeless tobacco: Never  Vaping Use   Vaping Use: Never used  Substance and Sexual Activity   Alcohol use: No   Drug use: No   Sexual activity:  Not on file  Other Topics Concern   Not on file  Social History Narrative   Not on file   Social Determinants of Health   Financial Resource Strain: Not on file  Food Insecurity: Not on file  Transportation Needs: Not on file  Physical Activity: Not on file  Stress: Not on file  Social Connections: Not on file  Intimate Partner Violence: Not on file    Review of systems: Review of Systems  Constitutional: Negative for fever and chills.  HENT: Negative.   Eyes: Negative for blurred vision.  Respiratory:  as per HPI  Cardiovascular: Negative for chest pain and palpitations.  Gastrointestinal: Negative for vomiting, diarrhea, blood per rectum. Genitourinary: Negative for dysuria, urgency, frequency and hematuria.  Musculoskeletal: Negative for myalgias, back pain and joint pain.  Skin: Negative for itching and rash.  Neurological: Negative for dizziness, tremors, focal weakness, seizures and loss of consciousness.  Endo/Heme/Allergies: Negative for environmental allergies.  Psychiatric/Behavioral: Negative for depression, suicidal ideas and hallucinations.  All other systems reviewed and are negative.  Physical Exam: Blood pressure 128/80, pulse 70, temperature 97.8 F (36.6 C), temperature source Oral, height 5' 3.5" (1.613 m), weight 163 lb (73.9 kg), SpO2 97 %. Gen:      No acute distress HEENT:  EOMI, sclera anicteric Neck:     No masses; no thyromegaly Lungs:    Clear to auscultation bilaterally; normal respiratory effort CV:         Regular rate and rhythm; no murmurs Abd:      + bowel sounds; soft, non-tender; no palpable masses, no distension Ext:    No edema; adequate peripheral perfusion Skin:      Warm and dry; no rash Neuro: alert and oriented x 3 Psych: normal mood and affect  Data Reviewed: Imaging: CT chest 07/08/2022 [[facility]]-mild biapical scarring, no acute pulmonary abnormality seen. CT angiogram 09/26/2022-no PE, mild tree-in-bud nodular appearance in the right lower lobe. I have reviewed the images personally.  PFTs:  FENO 11/03/2022 - 17  Labs:   Assessment:  Assessment for dyspnea, chronic bronchitis She may have reactive airway disease.  Check CBC differential, IgE Check FENO Will give a sample of Breztri.  If this improves her symptoms then we can call in a prescription  Prior CT reviewed with mild tree-in-bud opacities consistent with bronchitis, community-acquired pneumonia.  She is feeling better now and will follow-up with a CT in 3  months  Though she has mold exposure in the past her CT scan does not show significant interstitial lung disease.  Will review this on follow-up CT scan.  Plan/Recommendations: Check CBC, IgE Breztri inhaler High-res CT and PFTs in 3 months  Chilton Greathouse MD [facility] 11/03/2022, 2:08 PM  CC: Angelica Chessman, MD

## 2022-11-04 LAB — IGE: IgE (Immunoglobulin E), Serum: 82 kU/L (ref <=114)

## 2022-12-07 ENCOUNTER — Telehealth: Payer: Self-pay | Admitting: Pulmonary Disease

## 2022-12-07 ENCOUNTER — Other Ambulatory Visit: Payer: Self-pay

## 2022-12-07 MED ORDER — BREZTRI AEROSPHERE 160-9-4.8 MCG/ACT IN AERO: 2.0000 | INHALATION_SPRAY | Freq: Two times a day (BID) | RESPIRATORY_TRACT | 6 refills | Status: DC

## 2022-12-07 NOTE — Telephone Encounter (Signed)
Spoke with patient. She states Breztri inhaler has worked really well for her. I have sent a prescription to her pharmacy. Routing to Dr. Isaiah Serge as an Lorain Childes

## 2022-12-07 NOTE — Telephone Encounter (Signed)
Spoke with patient. She advises Markus Daft has worked really well for her. I sent a script to her pharmacy. Routing to Dr. Isaiah Serge as an Lorain Childes

## 2022-12-21 ENCOUNTER — Ambulatory Visit (HOSPITAL_BASED_OUTPATIENT_CLINIC_OR_DEPARTMENT_OTHER)
Admission: RE | Admit: 2022-12-21 | Discharge: 2022-12-21 | Disposition: A | Discharge status: Home / Self Care | Payer: Medicare Other | Source: Ambulatory Visit | Attending: Family Medicine | Admitting: Family Medicine

## 2022-12-21 DIAGNOSIS — R0602 Shortness of breath: Secondary | ICD-10-CM | POA: Diagnosis present

## 2023-01-19 ENCOUNTER — Ambulatory Visit (INDEPENDENT_AMBULATORY_CARE_PROVIDER_SITE_OTHER): Payer: Medicare Other | Admitting: Pulmonary Disease

## 2023-01-19 ENCOUNTER — Encounter: Payer: Self-pay | Admitting: Pulmonary Disease

## 2023-01-19 VITALS — BP 120/68 | HR 89 | Temp 98.0°F | Ht 63.5 in | Wt 167.8 lb

## 2023-01-19 DIAGNOSIS — R0602 Shortness of breath: Secondary | ICD-10-CM

## 2023-01-19 LAB — PULMONARY FUNCTION TEST
DL/VA % pred: 125 %
DL/VA: 5.12 ml/min/mmHg/L
DLCO cor % pred: 123 %
DLCO cor: 22.75 ml/min/mmHg
DLCO unc % pred: 123 %
DLCO unc: 22.75 ml/min/mmHg
FEF 25-75 Post: 2.89 L/sec
FEF 25-75 Pre: 2.05 L/sec
FEF2575-%Change-Post: 41 %
FEF2575-%Pred-Post: 209 %
FEF2575-%Pred-Pre: 148 %
FEV1-%Change-Post: 10 %
FEV1-%Pred-Post: 113 %
FEV1-%Pred-Pre: 103 %
FEV1-Post: 2.16 L
FEV1-Pre: 1.96 L
FEV1FVC-%Change-Post: 6 %
FEV1FVC-%Pred-Pre: 108 %
FEV6-%Change-Post: 3 %
FEV6-%Pred-Post: 104 %
FEV6-%Pred-Pre: 101 %
FEV6-Post: 2.53 L
FEV6-Pre: 2.45 L
FEV6FVC-%Change-Post: 0 %
FEV6FVC-%Pred-Post: 105 %
FEV6FVC-%Pred-Pre: 105 %
FVC-%Change-Post: 3 %
FVC-%Pred-Post: 99 %
FVC-%Pred-Pre: 96 %
FVC-Post: 2.53 L
FVC-Pre: 2.45 L
Post FEV1/FVC ratio: 85 %
Post FEV6/FVC ratio: 100 %
Pre FEV1/FVC ratio: 80 %
Pre FEV6/FVC Ratio: 100 %
RV % pred: 113 %
RV: 2.69 L
TLC % pred: 101 %
TLC: 5.04 L

## 2023-01-19 NOTE — Patient Instructions (Signed)
 Full PFT performed today. °

## 2023-01-19 NOTE — Progress Notes (Signed)
Debra Baird    [id number]    1941-05-25  Primary Care Physician:Aguiar, Genia Del, MD  Referring Physician: Angelica Chessman, MD [address] [place],  [place] [address]  Chief complaint: Follow for abnormal CT  HPI: 82 y.o. who  has a past medical history of Back pain, Hyperlipemia, Rotator cuff disorder, right, and Thyroid disease.   Complains of chronic cough, recurrent attacks of bronchitis which started in in August 2023.  She has been treated with multiple rounds of antibiotics including cefdinir, azithromycin and prednisone courses.  Pets: 2 dogs and cat Occupation: Works as a IT consultant Exposures: Recently found that she had significant mold issues in her bathroom and behind shower wall which was cleared out in early 2024.  No hot tubs, Jacuzzi, feather pillows or comforters. Smoking history: Quit smoking in 1972 Travel history: Previously lived in [place] and [place].  No significant recent travel Relevant family history: Mother and brother had COPD.  Dad had asthma.  Interim History: She is here for review of CT and lung function test.  Overall feels better since last visit.  Outpatient Encounter Medications as of 01/19/2023  Medication Sig   acetaminophen (TYLENOL) 500 MG tablet Take 500 mg by mouth every 6 (six) hours as needed.   Azelastine HCl 137 MCG/SPRAY SOLN Place 1 spray into the nose 2 (two) times daily.   Calcium Carb-Cholecalciferol (CALCIUM 600/VITAMIN D PO) Take by mouth. OTC calcium   levothyroxine (SYNTHROID, LEVOTHROID) 112 MCG tablet Take 112 mcg by mouth daily before breakfast.   pantoprazole (PROTONIX) 40 MG tablet Take 40 mg by mouth daily.   simvastatin (ZOCOR) 10 MG tablet Take 10 mg by mouth daily.   [DISCONTINUED] Budeson-Glycopyrrol-Formoterol (BREZTRI AEROSPHERE) 160-9-4.8 MCG/ACT AERO Inhale 2 puffs into the lungs in the morning and at bedtime.   [DISCONTINUED] Calcium Carb-Cholecalciferol (CALCIUM HIGH  POTENCY/VITAMIN D) 600-5 MG-MCG TABS Take by mouth.   No facility-administered encounter medications on file as of 01/19/2023.    Physical Exam: Blood pressure 120/68, pulse 89, temperature 98 F (36.7 C), temperature source Oral, height 5' 3.5" (1.613 m), weight 167 lb 12.8 oz (76.1 kg), SpO2 96%. Gen:      No acute distress HEENT:  EOMI, sclera anicteric Neck:     No masses; no thyromegaly Lungs:    Clear to auscultation bilaterally; normal respiratory effort CV:         Regular rate and rhythm; no murmurs Abd:      + bowel sounds; soft, non-tender; no palpable masses, no distension Ext:    No edema; adequate peripheral perfusion Skin:      Warm and dry; no rash Neuro: alert and oriented x 3 Psych: normal mood and affect   Data Reviewed: Imaging: CT chest 07/08/2022 [[facility]]-mild biapical scarring, no acute pulmonary abnormality seen. CT angiogram 09/26/2022-no PE, mild tree-in-bud nodular appearance in the right lower lobe. CT high res 12/21/2022-resolution of tree-in-bud nodular appearance.  Stable lung nodules I have reviewed the images personally.   PFTs: 01/19/23-normal spirometry, lung volumes. Increased diffusion capacity.  FENO 11/03/2022 - 17  Labs: CBC 11/03/2022-WBC 6.8, eos 2.1% IgE/30/24-82.  Assessment:  Assessment for dyspnea, chronic bronchitis She may have reactive airway disease.  Continue Breztri Your CT scan shows improvement and lung function tests are normal Follow-up in 6 months  Plan/Recommendations: Breztri inhaler  Chilton Greathouse MD [facility] 01/19/2023, 4:06 PM  CC: Angelica Chessman,  MD

## 2023-01-19 NOTE — Patient Instructions (Signed)
I am glad you are feeling better Your CT scan shows improvement in lung function tests are normal Follow-up in 6 months

## 2023-01-19 NOTE — Progress Notes (Signed)
 Full PFT performed today. °

## 2023-07-21 ENCOUNTER — Ambulatory Visit: Payer: Medicare Other | Admitting: Pulmonary Disease

## 2023-07-21 ENCOUNTER — Encounter: Payer: Self-pay | Admitting: Pulmonary Disease

## 2023-07-21 VITALS — BP 130/76 | HR 69 | Ht 63.0 in | Wt 171.0 lb

## 2023-07-21 DIAGNOSIS — R0602 Shortness of breath: Secondary | ICD-10-CM

## 2023-07-21 DIAGNOSIS — R911 Solitary pulmonary nodule: Secondary | ICD-10-CM | POA: Diagnosis not present

## 2023-07-21 NOTE — Progress Notes (Signed)
 Debra Baird    [id number]    1941-06-26  Primary Care Physician:Aguiar, Eleanor Greulich, MD  Referring Physician: Amadeo June, MD [address] [place],  [place] [address]  Chief complaint: Follow for abnormal CT  HPI: 83 y.o. who  has a past medical history of Back pain, Hyperlipemia, Rotator cuff disorder, right, and Thyroid disease.   Complains of chronic cough, recurrent attacks of bronchitis which started in in August 2023.  She has been treated with multiple rounds of antibiotics including cefdinir , azithromycin  and prednisone  courses.  Pets: 2 dogs and cat Occupation: Works as a IT consultant Exposures: Recently found that she had significant mold issues in her bathroom and behind shower wall which was cleared out in early 2024.  No hot tubs, Jacuzzi, feather pillows or comforters. Smoking history: 1 pack/week for 5 years. Quit smoking in 1972 Travel history: Previously lived in [place]  and [place] .  No significant recent travel Relevant family history: Mother and brother had COPD.  Dad had asthma.  Interim History: Discussed the use of AI scribe software for clinical note transcription with the patient, who gave verbal consent to proceed.  The patient, with a history of bronchitis, presents for a follow-up visit. She reports no current respiratory issues, with only occasional wheezing attributed to environmental dust. She was previously prescribed Breztri  inhaler, antibiotics, and prednisone  for bronchitis. However, she discontinued the Breztri  due to cost and did not feel it was necessary. She has a past history of smoking, quitting in 1972 after smoking about a pack a week for five years. She also has a small lung nodule, which was discovered during a previous visit.   Outpatient Encounter Medications as of 07/21/2023  Medication Sig   acetaminophen  (TYLENOL ) 500 MG tablet Take 500 mg by mouth every 6 (six) hours as needed.   Calcium Carb-Cholecalciferol  (CALCIUM 600/VITAMIN D PO) Take by mouth. OTC calcium   levothyroxine (SYNTHROID, LEVOTHROID) 112 MCG tablet Take 112 mcg by mouth daily before breakfast.   pantoprazole (PROTONIX) 40 MG tablet Take 40 mg by mouth daily.   simvastatin (ZOCOR) 10 MG tablet Take 10 mg by mouth daily.   [DISCONTINUED] Azelastine HCl 137 MCG/SPRAY SOLN Place 1 spray into the nose 2 (two) times daily.   No facility-administered encounter medications on file as of 07/21/2023.   Physical Exam: Blood pressure 130/76, pulse 69, height 5\' 3"  (1.6 m), weight 171 lb (77.6 kg), SpO2 97%. Gen:      No acute distress HEENT:  EOMI, sclera anicteric Neck:     No masses; no thyromegaly Lungs:    Clear to auscultation bilaterally; normal respiratory effort CV:         Regular rate and rhythm; no murmurs Abd:      + bowel sounds; soft, non-tender; no palpable masses, no distension Ext:    No edema; adequate peripheral perfusion Skin:      Warm and dry; no rash Neuro: alert and oriented x 3 Psych: normal mood and affect   Data Reviewed: Imaging: CT chest 07/08/2022 [[facility]]-mild biapical scarring, no acute pulmonary abnormality seen. CT angiogram 09/26/2022-no PE, mild tree-in-bud nodular appearance in the right lower lobe. CT high res 12/21/2022-resolution of tree-in-bud nodular appearance.  Stable lung nodules I have reviewed the images personally.  PFTs: 01/19/23-normal spirometry, lung volumes. Increased diffusion capacity.  FENO 11/03/2022 - 17  Labs: CBC 11/03/2022-WBC 6.8, eos 2.1% IgE/30/24-82.  Assessment/Plan:  Bronchitis Resolved. No current  respiratory symptoms. Breztri  discontinued due to cost and lack of necessity. -No further action required at this time.  Lung Nodule Small 3mm lung nodule noted on previous imaging. Minimal smoking history and normal lung function tests. -No follow-up CT scan required.  Follow-up as needed for any new or recurring respiratory symptoms.   Phyllis Breeze  MD [facility] 07/21/2023, 1:12 PM  CC: Amadeo June, MD

## 2023-07-21 NOTE — Patient Instructions (Signed)
 VISIT SUMMARY:  You came in for a follow-up visit regarding your bronchitis and lung nodule. You reported no current respiratory issues, and only occasional wheezing due to environmental dust. You had previously stopped using the Breztri  inhaler due to its cost and because you did not feel it was necessary.  YOUR PLAN:  -BRONCHITIS: Bronchitis is an inflammation of the bronchial tubes in the lungs, often causing coughing and difficulty breathing. Your bronchitis has resolved, and you have no current respiratory symptoms. No further action is required at this time.  -LUNG NODULE: A lung nodule is a small growth in the lung, which can be benign or malignant. Your 3mm lung nodule, discovered during a previous visit, is not concerning given your minimal smoking history and normal lung function tests. No follow-up CT scan is required.  INSTRUCTIONS:  Please follow up as needed for any new or recurring respiratory symptoms.

## 2023-12-27 ENCOUNTER — Emergency Department (HOSPITAL_BASED_OUTPATIENT_CLINIC_OR_DEPARTMENT_OTHER)
Admission: EM | Admit: 2023-12-27 | Discharge: 2023-12-27 | Disposition: A | Discharge status: Home / Self Care | Attending: Emergency Medicine | Admitting: Emergency Medicine

## 2023-12-27 ENCOUNTER — Encounter (HOSPITAL_BASED_OUTPATIENT_CLINIC_OR_DEPARTMENT_OTHER): Payer: Self-pay

## 2023-12-27 ENCOUNTER — Other Ambulatory Visit: Payer: Self-pay

## 2023-12-27 DIAGNOSIS — R21 Rash and other nonspecific skin eruption: Secondary | ICD-10-CM | POA: Diagnosis present

## 2023-12-27 MED ORDER — PREDNISONE 10 MG PO TABS: ORAL_TABLET | ORAL | 0 refills | Status: AC

## 2023-12-27 NOTE — ED Triage Notes (Signed)
 Pt presents with a dry, scaly, macular rash on scalp, torso, extremities, and palms that has been present for 4 months. The rash is pruritic and painful. It waxes and wanes in severity. Pt has seen multiple physicians including dermatology. Pt has had biopsy that was non-diagnostic. Pt has tried cream, doxepin, benadryl, and steroids. Pt reports she has temporary relief with steroids.  Pt states her dermatology was sending in another Rx for prednisone , but there was an error at the pharmacy and her dermatologist is on vacation now and unable to correct it.

## 2023-12-27 NOTE — Discharge Instructions (Signed)
 As discussed, recommend continued topical hydration through the over-the-counter creams or emollients such as Lubriderm, Eucerin or Aquaphor.  Will place you on a steroid taper until you follow-up with dermatology.  Please not hesitate to return to the emergency department if the worrisome signs and symptoms were discussed become apparent.

## 2023-12-27 NOTE — ED Provider Notes (Signed)
 [place] [facility] Provider Note   CSN: [id number] Arrival date & time: 12/27/23  1213     Patient presents with: Rash   Debra Baird is a 83 y.o. female.    Rash  83 year old female presents emergency department with complaints of rash.  Rash present for the past few months.  States that she has been on multiple creams as well as a few different courses of steroids.  States that the oral steroid courses have helped when she has been on them.  Currently follows with dermatology.  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  States that she tried Aquaphor which did help with some of the itching and burning sensation of the rash.  States that rash is currently on her arms, upper thighs, tops of her feet, back, abdomen.  States that her next appointment with her dermatologist is in 3 weeks.  States that her dermatologist sent in a 3-week prescription of prednisone  taper but the pharmacist was unable to fill it due to confusing instructions; the dermatologist was on vacation and was unable to clarify instructions prompting visit to the emergency department.  Patient denies any new soaps, lotions, fragrances, detergents.  Denies any new medications.  Denies any known allergen exposure.  States he has not dealt with rash like this prior to 2 symptom onset a few months ago.  Past medical history significant for thyroid disease, hyperlipidemia, back pain, rotator cuff disorder    Prior to Admission medications   Medication Sig Start Date End Date Taking? Authorizing Provider  acetaminophen  (TYLENOL ) 500 MG tablet Take 500 mg by mouth every 6 (six) hours as needed.    [provider]  Calcium Carb-Cholecalciferol (CALCIUM 600/VITAMIN D PO) Take by mouth. OTC calcium    [provider]  levothyroxine (SYNTHROID, LEVOTHROID) 112 MCG tablet Take 112 mcg by mouth daily before breakfast.    [provider]   pantoprazole (PROTONIX) 40 MG tablet Take 40 mg by mouth daily. 06/15/22 01/08/24  [provider]  simvastatin (ZOCOR) 10 MG tablet Take 10 mg by mouth daily.    [provider]    Allergies: Amoxicillin-pot clavulanate, Aspirin, Nitrofurantoin macrocrystal, Nsaids, Celecoxib, and Cyclobenzaprine    Review of Systems  Skin:  Positive for rash.  All other systems reviewed and are negative.   Updated Vital Signs BP 125/78 (BP Location: Left Arm)   Pulse 85   Temp 98.4 F (36.9 C) (Oral)   Resp 18   Ht 5' 3 (1.6 m)   Wt 77.1 kg   SpO2 99%   BMI 30.11 kg/m   Physical Exam Vitals and nursing note reviewed.  Constitutional:      General: She is not in acute distress.    Appearance: She is well-developed.  HENT:     Head: Normocephalic and atraumatic.   Eyes:     Conjunctiva/sclera: Conjunctivae normal.    Cardiovascular:     Rate and Rhythm: Normal rate and regular rhythm.  Pulmonary:     Effort: Pulmonary effort is normal. No respiratory distress.     Breath sounds: Normal breath sounds.  Abdominal:     Palpations: Abdomen is soft.     Tenderness: There is no abdominal tenderness.   Musculoskeletal:        General: No swelling.     Cervical back: Neck supple.   Skin:    General: Skin is warm and dry.     Capillary Refill:  Capillary refill takes less than 2 seconds.     Findings: Rash present.     Comments: Macular/plaque-like rash appreciated bilateral thighs, tops of feet, forearms, hands, back, abdomen.  Excoriations present.  No palpable warmth, induration.  No obvious oropharyngeal involvement.   Neurological:     Mental Status: She is alert.   Psychiatric:        Mood and Affect: Mood normal.     (all labs ordered are listed, but only abnormal results are displayed) Labs Reviewed - No data to display  EKG: None  Radiology: No results found.   Procedures   Medications Ordered in the ED - No data to display                                   Medical Decision Making Risk Prescription drug management.   This patient presents to the ED for concern of rash, this involves an extensive number of treatment options, and is a complaint that carries with it a high risk of complications and morbidity.  The differential diagnosis includes SJS/TEN, contact dermatitis, eczema, drug eruption, cellulitis, erysipelas, necrotizing infection, DIC, ITP, other   Co morbidities that complicate the patient evaluation  See HPI   Additional history obtained:  Additional history obtained from EMR External records from outside source obtained and reviewed including hospital records.  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  Denies illicit drug use.   Test / Admission - Considered:  Rash Vitals signs within normal range and stable throughout visit. 83 year old female presents emergency department with complaints of rash.  Rash present for the past few months.  States that she has been on multiple creams as well as a few different courses of steroids.  States that the oral steroid courses have helped when she has been on them.  Currently follows with dermatology.  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  States that she tried Aquaphor which did help with some of the itching and burning sensation of the rash.  States that rash is currently on her arms, upper thighs, tops of her feet, back, abdomen.  States that her next appointment with her dermatologist is in 3 weeks.   States that her dermatologist sent in a 3-week prescription of prednisone  taper but the pharmacist was unable to fill it due to confusing instructions; the dermatologist was on vacation and was unable to clarify instructions prompting visit to the emergency department.  Patient denies any new soaps, lotions, fragrances, detergents.  Denies any new medications.  Denies any known allergen exposure.  States he has not dealt with rash like this prior to 2 symptom onset a few months ago. On exam plaque-like/macular rash appreciated on back, abdomen, arms, legs as above.  Patient did have biopsy of this rash which showed spongiform dermatitis.  No evidence of SJS/TENS or other emergent rash.  No overlying skin changes concerning for secondary bacterial infection such as cellulitis, erysipelas, necrotizing infection.  Given patient's  improvement with steroid taper in the past with dermatologist plan for steroid taper as shown on patient's MyChart app on her phone, will begin taper and recommend further symptomatic therapy as described in AVS until close follow-up with dermatology in 3 weeks.  Treatment plan discussed with patient and she acknowledged understanding was agreeable to said plan.  Patient overall well-appearing, afebrile in no acute distress. Worrisome signs and symptoms were discussed with the patient, and the patient acknowledged understanding to return to the ED if noticed. Patient was stable upon discharge.      Final diagnoses:  None    ED Discharge Orders     None          Silver Wonda LABOR, [place] 12/27/23 1317    Tegeler, Lonni PARAS, MD 12/27/23 [phone]
